# Patient Record
Sex: Female | Born: 1945 | Race: White | Hispanic: No | Marital: Married
Health system: Southern US, Community
[De-identification: ages and names within clinical notes are randomized; demographics above are authoritative.]

---

## 1999-04-05 ENCOUNTER — Encounter: Payer: Self-pay | Admitting: Family Medicine

## 1999-04-05 ENCOUNTER — Encounter: Admission: RE | Admit: 1999-04-05 | Discharge: 1999-04-05 | Payer: Self-pay | Admitting: Family Medicine

## 1999-05-26 ENCOUNTER — Encounter: Payer: Self-pay | Admitting: Family Medicine

## 1999-05-26 ENCOUNTER — Encounter: Admission: RE | Admit: 1999-05-26 | Discharge: 1999-05-26 | Payer: Self-pay | Admitting: Family Medicine

## 2000-04-10 ENCOUNTER — Encounter: Admission: RE | Admit: 2000-04-10 | Discharge: 2000-04-10 | Payer: Self-pay | Admitting: Family Medicine

## 2000-04-10 ENCOUNTER — Encounter: Payer: Self-pay | Admitting: Family Medicine

## 2001-04-16 ENCOUNTER — Encounter: Admission: RE | Admit: 2001-04-16 | Discharge: 2001-04-16 | Payer: Self-pay | Admitting: Family Medicine

## 2001-04-16 ENCOUNTER — Encounter: Payer: Self-pay | Admitting: Family Medicine

## 2002-11-17 ENCOUNTER — Ambulatory Visit (HOSPITAL_BASED_OUTPATIENT_CLINIC_OR_DEPARTMENT_OTHER): Admission: RE | Admit: 2002-11-17 | Discharge: 2002-11-17 | Payer: Self-pay | Admitting: Podiatry

## 2003-06-10 ENCOUNTER — Encounter: Admission: RE | Admit: 2003-06-10 | Discharge: 2003-06-10 | Payer: Self-pay | Admitting: Family Medicine

## 2003-06-18 ENCOUNTER — Other Ambulatory Visit: Admission: RE | Admit: 2003-06-18 | Discharge: 2003-06-18 | Payer: Self-pay | Admitting: Family Medicine

## 2003-08-07 ENCOUNTER — Ambulatory Visit (HOSPITAL_COMMUNITY): Admission: RE | Admit: 2003-08-07 | Discharge: 2003-08-07 | Payer: Self-pay | Admitting: Gastroenterology

## 2003-08-07 ENCOUNTER — Encounter (INDEPENDENT_AMBULATORY_CARE_PROVIDER_SITE_OTHER): Payer: Self-pay | Admitting: Specialist

## 2004-06-16 ENCOUNTER — Encounter: Admission: RE | Admit: 2004-06-16 | Discharge: 2004-06-16 | Payer: Self-pay | Admitting: Family Medicine

## 2005-06-19 ENCOUNTER — Encounter: Admission: RE | Admit: 2005-06-19 | Discharge: 2005-06-19 | Payer: Self-pay | Admitting: Family Medicine

## 2005-07-05 ENCOUNTER — Other Ambulatory Visit: Admission: RE | Admit: 2005-07-05 | Discharge: 2005-07-05 | Payer: Self-pay | Admitting: Family Medicine

## 2006-07-13 ENCOUNTER — Encounter: Admission: RE | Admit: 2006-07-13 | Discharge: 2006-07-13 | Payer: Self-pay | Admitting: Family Medicine

## 2007-07-18 ENCOUNTER — Encounter: Admission: RE | Admit: 2007-07-18 | Discharge: 2007-07-18 | Payer: Self-pay | Admitting: Family Medicine

## 2008-07-21 ENCOUNTER — Encounter: Admission: RE | Admit: 2008-07-21 | Discharge: 2008-07-21 | Payer: Self-pay | Admitting: Family Medicine

## 2008-07-29 ENCOUNTER — Other Ambulatory Visit: Admission: RE | Admit: 2008-07-29 | Discharge: 2008-07-29 | Payer: Self-pay | Admitting: Family Medicine

## 2008-08-04 ENCOUNTER — Encounter: Admission: RE | Admit: 2008-08-04 | Discharge: 2008-08-04 | Payer: Self-pay | Admitting: Family Medicine

## 2009-07-22 ENCOUNTER — Encounter: Admission: RE | Admit: 2009-07-22 | Discharge: 2009-07-22 | Payer: Self-pay | Admitting: Family Medicine

## 2010-06-18 ENCOUNTER — Other Ambulatory Visit: Payer: Self-pay | Admitting: Family Medicine

## 2010-06-18 DIAGNOSIS — Z1239 Encounter for other screening for malignant neoplasm of breast: Secondary | ICD-10-CM

## 2010-08-01 ENCOUNTER — Ambulatory Visit
Admission: RE | Admit: 2010-08-01 | Discharge: 2010-08-01 | Disposition: A | Discharge status: Home / Self Care | Payer: Medicare Other | Source: Ambulatory Visit | Attending: Family Medicine | Admitting: Family Medicine

## 2010-08-01 DIAGNOSIS — Z1239 Encounter for other screening for malignant neoplasm of breast: Secondary | ICD-10-CM

## 2010-10-14 NOTE — Op Note (Signed)
NAME:  Katherine Lara, Katherine Lara                            ACCOUNT NO.:  [id number]   MEDICAL RECORD NO.:  [id number]                   PATIENT TYPE:  AMB   LOCATION:  ENDO                                 FACILITY:  [facility]   PHYSICIAN:  Bernette Redbird, M.D.                DATE OF BIRTH:  1945-11-24   DATE OF PROCEDURE:  08/07/2003  DATE OF DISCHARGE:                                 OPERATIVE REPORT   PROCEDURE:  Colonoscopy with polypectomy and biopsy.   INDICATIONS:  First ever colonoscopy for colon cancer screening in a 65-year-  old female with a negative sigmoidoscopy several years ago.   FINDINGS:  1. Difficult exam.  2. Two small polyps removed.   DESCRIPTION OF PROCEDURE:  The nature, purpose and risks of the procedure  have been discussed with the patient who provided written consent.  Sedation  was fentanyl 100 mcg and Versed 10 mg IV without arrhythmias or  desaturation.   The Olympus adjustable tension pediatric video coloscope was advanced around  the colon with considerable looping which we were unable to control by  external abdominal depression.  Eventually, I pulled back the scope, got out  a major loop and was able to advance to the base of the cecum using external  abdominal depression.  Again the pull-back was then performed.  It is felt  that the adult scope might be better to use on future occasions.   Just above the cecum was roughly 3-4 mm sessile polyp removed by the  polypectomy snare, being transected and excised before any cautery could be  applied.  Polyp fragments were suctioned through the scope for histologic  analysis.  There was a __________ transient ooze with an estimated blood  loss of less than 1 mL.  Pull-back was then continued around the colon.  At  32 cm there was a sessile hyperplastic-appearing very flat polyp biopsied  x1.   There was a rare left-sided diverticulum.  No large polyps, cancer, colitis,  or vascular malformations were seen.   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   The patient tolerated the procedure well and there were no apparent  complications.   IMPRESSION:  1. Small colon polyps treated as described above (211.3).  2. Minimal left-sided diverticulosis.  3. Technically difficult exam   PLAN:  Await pathology on the polyps.                                               Bernette Redbird, M.D.    RB/MEDQ  D:  08/07/2003  T:  08/07/2003  Job:  [id number]   cc:   Pam Drown, M.D.  [address]  [place]  [place] [address]  Fax: [phone]

## 2010-10-14 NOTE — Op Note (Signed)
   NAME:  Katherine Lara, Katherine Lara                            ACCOUNT NO.:  [id number]   MEDICAL RECORD NO.:  [id number]                   PATIENT TYPE:  AMB   LOCATION:  DSC                                  FACILITY:  [facility]   PHYSICIAN:  Ezequiel Kayser. Ajlouny, D.P.M.           DATE OF BIRTH:  Nov 10, 1945   DATE OF PROCEDURE:  11/17/2002  DATE OF DISCHARGE:                                 OPERATIVE REPORT   PREOPERATIVE DIAGNOSIS:  Paronychia medial border left great toenail and  lytic great toenails bilaterally.   POSTOPERATIVE DIAGNOSIS:  Paronychia medial border left great toenail and  lytic great toenails bilaterally.   PROCEDURE:  Excision nail matrix medial border, left great toenail and  debridement both great toenails bilaterally.   SURGEON:  Ezequiel Kayser. Ajlouny, D.P.M.   COMPLICATIONS:  None.   DESCRIPTION OF PROCEDURE:  The patient was brought to the operating room and  placed in the supine position at which time monitored anesthesia was  administered.  A local block was performed with a total of 5 mL of a mixture  of 2% lidocaine and 0.5% Marcaine plain.  A latex-free glove was used to  exsanguinate the left hallux after it was prepped with Betadine.  Using the  English anvil, the invading border of the nail was excised and removed.  Three applications of Phenol for 30 seconds each were applied, cauterized  the matrix.  The area was debrided with curettage.  The phenol was then  neutralized with alcohol.  The left great toenail was then debrided of all  lytic nail.  The toe was dressed with Bacitracin, 4 x 4, and Kling.  The  latex glove used as a tourniquet was removed, and capillary refill returned  to preoperative levels.   Attention was directed to the right great toenail which was also debrided of  all lytic portions from the nail.  The patient was then sent to the recovery  room with vital signs stable and capillary refill at preoperative levels.  Postoperative instructions were  given to the patient.  Postoperative shoes  discussed.  The patient is instructed to follow up and to contact the office  immediately if there are any problems.                                               Ezequiel Kayser. Harriet Pho, D.P.M.    MJA/MEDQ  D:  11/17/2002  T:  11/18/2002  Job:  [id number]   cc:   Charlynn Court, M.D.  [address]  [place], [place] [address]

## 2011-06-27 ENCOUNTER — Other Ambulatory Visit: Payer: Self-pay | Admitting: Family Medicine

## 2011-06-27 DIAGNOSIS — Z1231 Encounter for screening mammogram for malignant neoplasm of breast: Secondary | ICD-10-CM

## 2011-08-02 ENCOUNTER — Ambulatory Visit
Admission: RE | Admit: 2011-08-02 | Discharge: 2011-08-02 | Disposition: A | Discharge status: Home / Self Care | Payer: Medicare Other | Source: Ambulatory Visit | Attending: Family Medicine | Admitting: Family Medicine

## 2011-08-02 DIAGNOSIS — Z1231 Encounter for screening mammogram for malignant neoplasm of breast: Secondary | ICD-10-CM

## 2012-07-05 ENCOUNTER — Other Ambulatory Visit: Payer: Self-pay | Admitting: Family Medicine

## 2012-07-05 DIAGNOSIS — Z1231 Encounter for screening mammogram for malignant neoplasm of breast: Secondary | ICD-10-CM

## 2012-08-02 ENCOUNTER — Ambulatory Visit: Payer: Medicare Other

## 2012-08-06 ENCOUNTER — Ambulatory Visit
Admission: RE | Admit: 2012-08-06 | Discharge: 2012-08-06 | Disposition: A | Discharge status: Home / Self Care | Payer: Medicare Other | Source: Ambulatory Visit | Attending: Family Medicine | Admitting: Family Medicine

## 2012-08-06 DIAGNOSIS — Z1231 Encounter for screening mammogram for malignant neoplasm of breast: Secondary | ICD-10-CM

## 2012-08-19 ENCOUNTER — Other Ambulatory Visit (HOSPITAL_COMMUNITY)
Admission: RE | Admit: 2012-08-19 | Discharge: 2012-08-19 | Disposition: A | Discharge status: Home / Self Care | Payer: Medicare Other | Source: Ambulatory Visit | Attending: Family Medicine | Admitting: Family Medicine

## 2012-08-19 ENCOUNTER — Other Ambulatory Visit: Payer: Self-pay | Admitting: Family Medicine

## 2012-08-19 DIAGNOSIS — Z1151 Encounter for screening for human papillomavirus (HPV): Secondary | ICD-10-CM | POA: Insufficient documentation

## 2012-08-19 DIAGNOSIS — Z0000 Encounter for general adult medical examination without abnormal findings: Secondary | ICD-10-CM | POA: Insufficient documentation

## 2013-07-02 ENCOUNTER — Other Ambulatory Visit: Payer: Self-pay

## 2013-07-02 DIAGNOSIS — Z1231 Encounter for screening mammogram for malignant neoplasm of breast: Secondary | ICD-10-CM

## 2013-08-07 ENCOUNTER — Ambulatory Visit
Admission: RE | Admit: 2013-08-07 | Discharge: 2013-08-07 | Disposition: A | Discharge status: Home / Self Care | Payer: Medicare Other | Source: Ambulatory Visit

## 2013-08-07 DIAGNOSIS — Z1231 Encounter for screening mammogram for malignant neoplasm of breast: Secondary | ICD-10-CM

## 2014-07-06 ENCOUNTER — Other Ambulatory Visit: Payer: Self-pay

## 2014-07-06 DIAGNOSIS — Z1231 Encounter for screening mammogram for malignant neoplasm of breast: Secondary | ICD-10-CM

## 2014-08-17 ENCOUNTER — Ambulatory Visit
Admission: RE | Admit: 2014-08-17 | Discharge: 2014-08-17 | Disposition: A | Discharge status: Home / Self Care | Payer: Medicare Other | Source: Ambulatory Visit

## 2014-08-17 DIAGNOSIS — Z1231 Encounter for screening mammogram for malignant neoplasm of breast: Secondary | ICD-10-CM

## 2015-07-12 ENCOUNTER — Other Ambulatory Visit: Payer: Self-pay

## 2015-07-12 DIAGNOSIS — Z1231 Encounter for screening mammogram for malignant neoplasm of breast: Secondary | ICD-10-CM

## 2015-08-23 ENCOUNTER — Ambulatory Visit
Admission: RE | Admit: 2015-08-23 | Discharge: 2015-08-23 | Disposition: A | Discharge status: Home / Self Care | Payer: Medicare Other | Source: Ambulatory Visit

## 2015-08-23 DIAGNOSIS — Z1231 Encounter for screening mammogram for malignant neoplasm of breast: Secondary | ICD-10-CM

## 2016-07-17 ENCOUNTER — Other Ambulatory Visit: Payer: Self-pay | Admitting: Family Medicine

## 2016-07-17 DIAGNOSIS — Z1231 Encounter for screening mammogram for malignant neoplasm of breast: Secondary | ICD-10-CM

## 2016-08-25 ENCOUNTER — Ambulatory Visit
Admission: RE | Admit: 2016-08-25 | Discharge: 2016-08-25 | Disposition: A | Discharge status: Home / Self Care | Payer: Medicare Other | Source: Ambulatory Visit | Attending: Family Medicine | Admitting: Family Medicine

## 2016-08-25 DIAGNOSIS — Z1231 Encounter for screening mammogram for malignant neoplasm of breast: Secondary | ICD-10-CM

## 2017-07-19 ENCOUNTER — Other Ambulatory Visit: Payer: Self-pay | Admitting: Family Medicine

## 2017-07-19 DIAGNOSIS — Z1231 Encounter for screening mammogram for malignant neoplasm of breast: Secondary | ICD-10-CM

## 2017-08-28 ENCOUNTER — Ambulatory Visit
Admission: RE | Admit: 2017-08-28 | Discharge: 2017-08-28 | Disposition: A | Discharge status: Home / Self Care | Payer: Medicare Other | Source: Ambulatory Visit | Attending: Family Medicine | Admitting: Family Medicine

## 2017-08-28 DIAGNOSIS — Z1231 Encounter for screening mammogram for malignant neoplasm of breast: Secondary | ICD-10-CM

## 2018-07-31 ENCOUNTER — Other Ambulatory Visit: Payer: Self-pay | Admitting: Family Medicine

## 2018-07-31 DIAGNOSIS — Z1231 Encounter for screening mammogram for malignant neoplasm of breast: Secondary | ICD-10-CM

## 2018-09-02 ENCOUNTER — Ambulatory Visit: Payer: Medicare Other

## 2018-10-14 ENCOUNTER — Ambulatory Visit: Payer: Medicare Other

## 2018-11-25 ENCOUNTER — Ambulatory Visit
Admission: RE | Admit: 2018-11-25 | Discharge: 2018-11-25 | Disposition: A | Discharge status: Home / Self Care | Payer: Medicare Other | Source: Ambulatory Visit | Attending: Family Medicine | Admitting: Family Medicine

## 2018-11-25 ENCOUNTER — Other Ambulatory Visit: Payer: Self-pay

## 2018-11-25 DIAGNOSIS — Z1231 Encounter for screening mammogram for malignant neoplasm of breast: Secondary | ICD-10-CM

## 2019-06-11 ENCOUNTER — Ambulatory Visit: Payer: Medicare Other | Attending: Internal Medicine

## 2019-06-11 DIAGNOSIS — Z23 Encounter for immunization: Secondary | ICD-10-CM | POA: Insufficient documentation

## 2019-06-11 NOTE — Progress Notes (Signed)
   Covid-19 Vaccination Clinic  Name:  Tammatha Primavera    MRN: [id number] DOB: 03-Jul-1945  06/11/2019  Ms. Faas was observed post Covid-19 immunization for 15 minutes without incidence. She was provided with Vaccine Information Sheet and instruction to access the V-Safe system.   Ms. Lanter was instructed to call 911 with any severe reactions post vaccine: Marland Kitchen Difficulty breathing  . Swelling of your face and throat  . A fast heartbeat  . A bad rash all over your body  . Dizziness and weakness    Immunizations Administered    Name Date Dose VIS Date Route   Pfizer COVID-19 Vaccine 06/11/2019  9:25 AM 0.3 mL 05/09/2019 Intramuscular   Manufacturer: [employer]   Lot: [id number]   [place]: [id number]

## 2019-07-01 ENCOUNTER — Ambulatory Visit: Payer: Medicare Other | Attending: Internal Medicine

## 2019-07-01 DIAGNOSIS — Z23 Encounter for immunization: Secondary | ICD-10-CM | POA: Insufficient documentation

## 2019-07-01 NOTE — Progress Notes (Signed)
   Covid-19 Vaccination Clinic  Name:  Kaaliyah Adan    MRN: [id number] DOB: [birth year]  07/01/2019  Ms. Brizzi was observed post Covid-19 immunization for 15 minutes without incidence. She was provided with Vaccine Information Sheet and instruction to access the V-Safe system.   Ms. Oday was instructed to call 911 with any severe reactions post vaccine: Marland Kitchen Difficulty breathing  . Swelling of your face and throat  . A fast heartbeat  . A bad rash all over your body  . Dizziness and weakness    Immunizations Administered    Name Date Dose VIS Date Route   Pfizer COVID-19 Vaccine 07/01/2019  8:57 AM 0.3 mL 05/09/2019 Intramuscular   Manufacturer: [place]   Lot: [id number]   [place]: [id number]

## 2019-10-13 ENCOUNTER — Other Ambulatory Visit: Payer: Self-pay | Admitting: Family Medicine

## 2019-10-13 DIAGNOSIS — Z1231 Encounter for screening mammogram for malignant neoplasm of breast: Secondary | ICD-10-CM

## 2019-11-26 ENCOUNTER — Ambulatory Visit
Admission: RE | Admit: 2019-11-26 | Discharge: 2019-11-26 | Disposition: A | Discharge status: Home / Self Care | Payer: Medicare Other | Source: Ambulatory Visit | Attending: Family Medicine | Admitting: Family Medicine

## 2019-11-26 ENCOUNTER — Other Ambulatory Visit: Payer: Self-pay

## 2019-11-26 DIAGNOSIS — Z1231 Encounter for screening mammogram for malignant neoplasm of breast: Secondary | ICD-10-CM

## 2020-07-01 DIAGNOSIS — C719 Malignant neoplasm of brain, unspecified: Secondary | ICD-10-CM | POA: Diagnosis not present

## 2020-07-01 DIAGNOSIS — Z510 Encounter for antineoplastic radiation therapy: Secondary | ICD-10-CM | POA: Diagnosis not present

## 2020-07-01 DIAGNOSIS — D331 Benign neoplasm of brain, infratentorial: Secondary | ICD-10-CM | POA: Diagnosis not present

## 2020-07-01 DIAGNOSIS — C716 Malignant neoplasm of cerebellum: Secondary | ICD-10-CM | POA: Diagnosis not present

## 2020-09-14 DIAGNOSIS — L309 Dermatitis, unspecified: Secondary | ICD-10-CM | POA: Diagnosis not present

## 2020-10-13 ENCOUNTER — Other Ambulatory Visit: Payer: Self-pay | Admitting: Family Medicine

## 2020-10-13 DIAGNOSIS — Z1231 Encounter for screening mammogram for malignant neoplasm of breast: Secondary | ICD-10-CM

## 2020-11-23 DIAGNOSIS — B882 Other arthropod infestations: Secondary | ICD-10-CM | POA: Diagnosis not present

## 2020-11-23 DIAGNOSIS — L309 Dermatitis, unspecified: Secondary | ICD-10-CM | POA: Diagnosis not present

## 2020-11-25 DIAGNOSIS — U071 COVID-19: Secondary | ICD-10-CM | POA: Diagnosis not present

## 2020-12-09 ENCOUNTER — Ambulatory Visit
Admission: RE | Admit: 2020-12-09 | Discharge: 2020-12-09 | Disposition: A | Discharge status: Home / Self Care | Payer: Medicare Other | Source: Ambulatory Visit | Attending: Family Medicine | Admitting: Family Medicine

## 2020-12-09 ENCOUNTER — Other Ambulatory Visit: Payer: Self-pay

## 2020-12-09 DIAGNOSIS — Z1231 Encounter for screening mammogram for malignant neoplasm of breast: Secondary | ICD-10-CM | POA: Diagnosis not present

## 2021-01-03 DIAGNOSIS — C719 Malignant neoplasm of brain, unspecified: Secondary | ICD-10-CM | POA: Diagnosis not present

## 2021-01-10 DIAGNOSIS — M85852 Other specified disorders of bone density and structure, left thigh: Secondary | ICD-10-CM | POA: Diagnosis not present

## 2021-01-10 DIAGNOSIS — E673 Hypervitaminosis D: Secondary | ICD-10-CM | POA: Diagnosis not present

## 2021-01-10 DIAGNOSIS — Z0000 Encounter for general adult medical examination without abnormal findings: Secondary | ICD-10-CM | POA: Diagnosis not present

## 2021-01-10 DIAGNOSIS — Z136 Encounter for screening for cardiovascular disorders: Secondary | ICD-10-CM | POA: Diagnosis not present

## 2021-01-10 DIAGNOSIS — R7301 Impaired fasting glucose: Secondary | ICD-10-CM | POA: Diagnosis not present

## 2021-01-10 DIAGNOSIS — C716 Malignant neoplasm of cerebellum: Secondary | ICD-10-CM | POA: Diagnosis not present

## 2021-01-10 DIAGNOSIS — Z13220 Encounter for screening for lipoid disorders: Secondary | ICD-10-CM | POA: Diagnosis not present

## 2021-01-10 DIAGNOSIS — Z1389 Encounter for screening for other disorder: Secondary | ICD-10-CM | POA: Diagnosis not present

## 2021-02-16 DIAGNOSIS — H524 Presbyopia: Secondary | ICD-10-CM | POA: Diagnosis not present

## 2021-02-16 DIAGNOSIS — Z961 Presence of intraocular lens: Secondary | ICD-10-CM | POA: Diagnosis not present

## 2021-02-16 DIAGNOSIS — H5213 Myopia, bilateral: Secondary | ICD-10-CM | POA: Diagnosis not present

## 2021-02-16 DIAGNOSIS — H35363 Drusen (degenerative) of macula, bilateral: Secondary | ICD-10-CM | POA: Diagnosis not present

## 2021-10-05 ENCOUNTER — Other Ambulatory Visit: Payer: Self-pay | Admitting: Family Medicine

## 2021-10-05 DIAGNOSIS — M85852 Other specified disorders of bone density and structure, left thigh: Secondary | ICD-10-CM

## 2021-10-09 DIAGNOSIS — L247 Irritant contact dermatitis due to plants, except food: Secondary | ICD-10-CM | POA: Diagnosis not present

## 2021-10-26 ENCOUNTER — Other Ambulatory Visit: Payer: Self-pay | Admitting: Family Medicine

## 2021-10-26 DIAGNOSIS — Z1231 Encounter for screening mammogram for malignant neoplasm of breast: Secondary | ICD-10-CM

## 2021-12-09 IMAGING — MG MM DIGITAL SCREENING BILAT W/ TOMO AND CAD
6 of 10 series · 6 of 30 positions shown · non-contrast
Comparison: Previous exam(s).

CLINICAL DATA: Screening.

EXAM:
DIGITAL SCREENING BILATERAL MAMMOGRAM WITH TOMOSYNTHESIS AND CAD
TECHNIQUE: Bilateral screening digital craniocaudal and mediolateral oblique
mammograms were obtained. Bilateral screening digital breast
tomosynthesis was performed. The images were evaluated with
computer-aided detection.

[R CC synth-2D (1 of 2)]
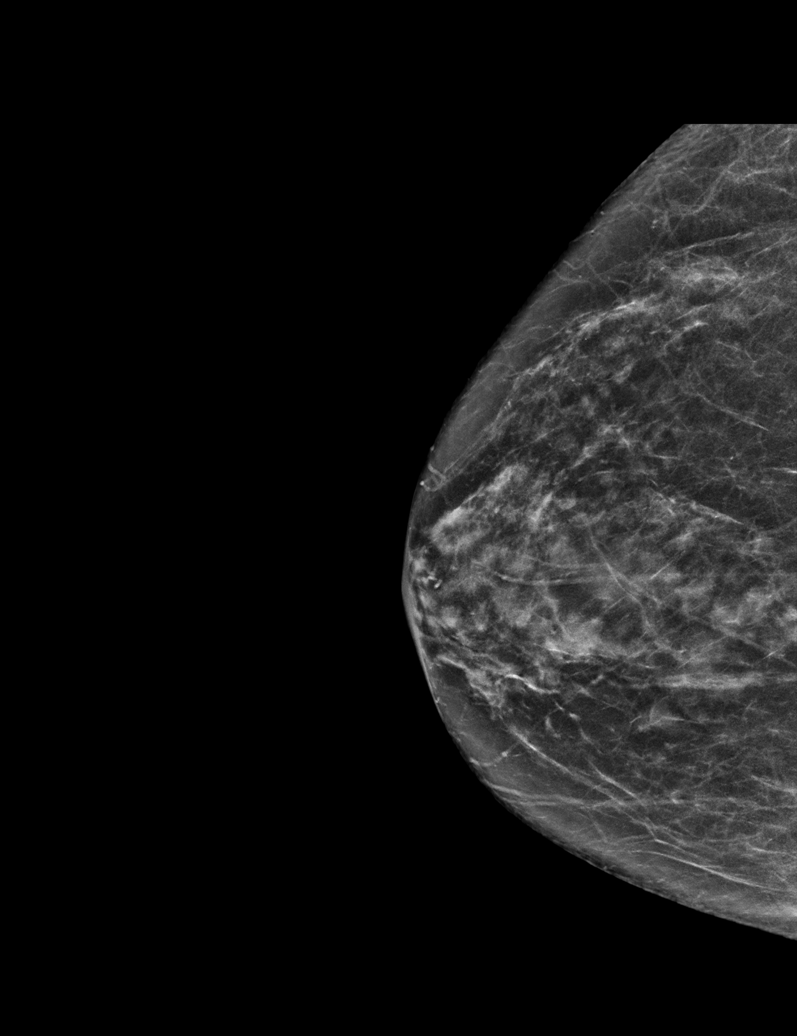

[R MLO synth-2D]
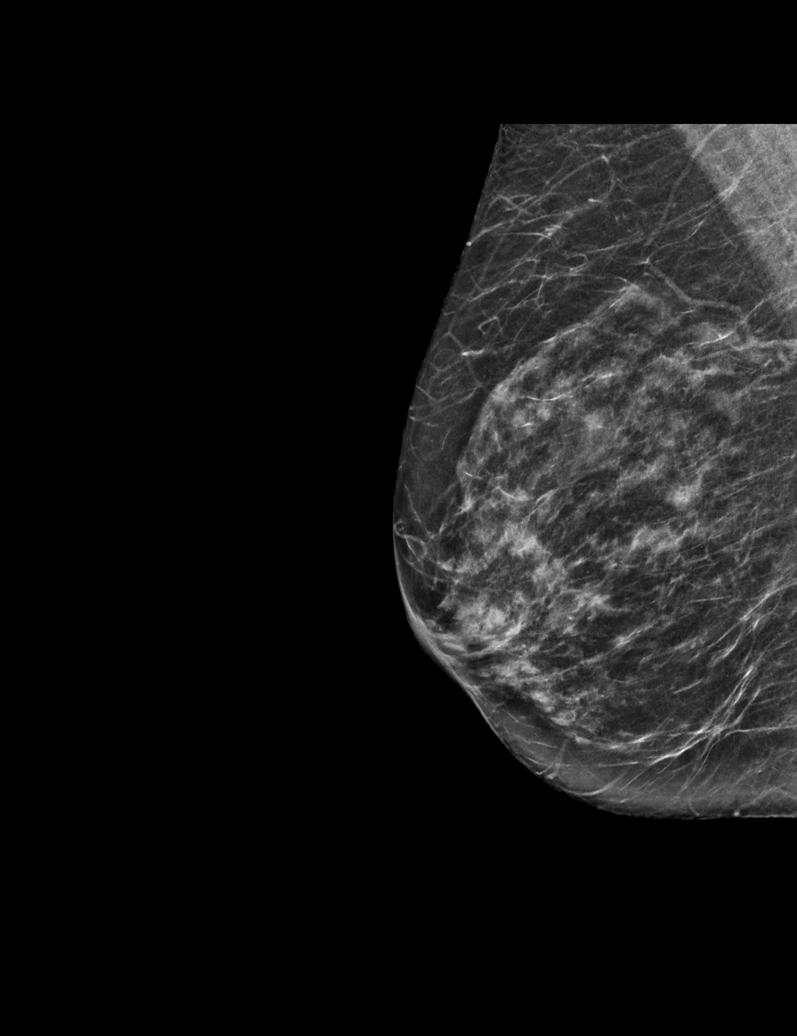

[L MLO synth-2D]
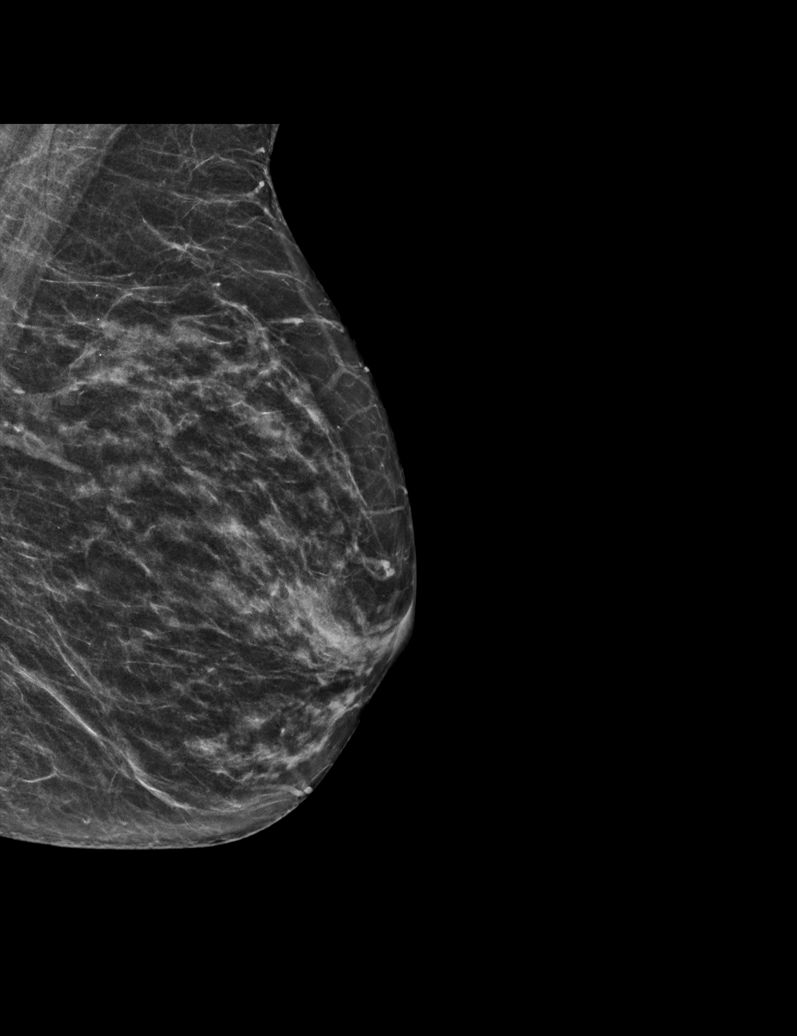

[R CC synth-2D (2 of 2)]
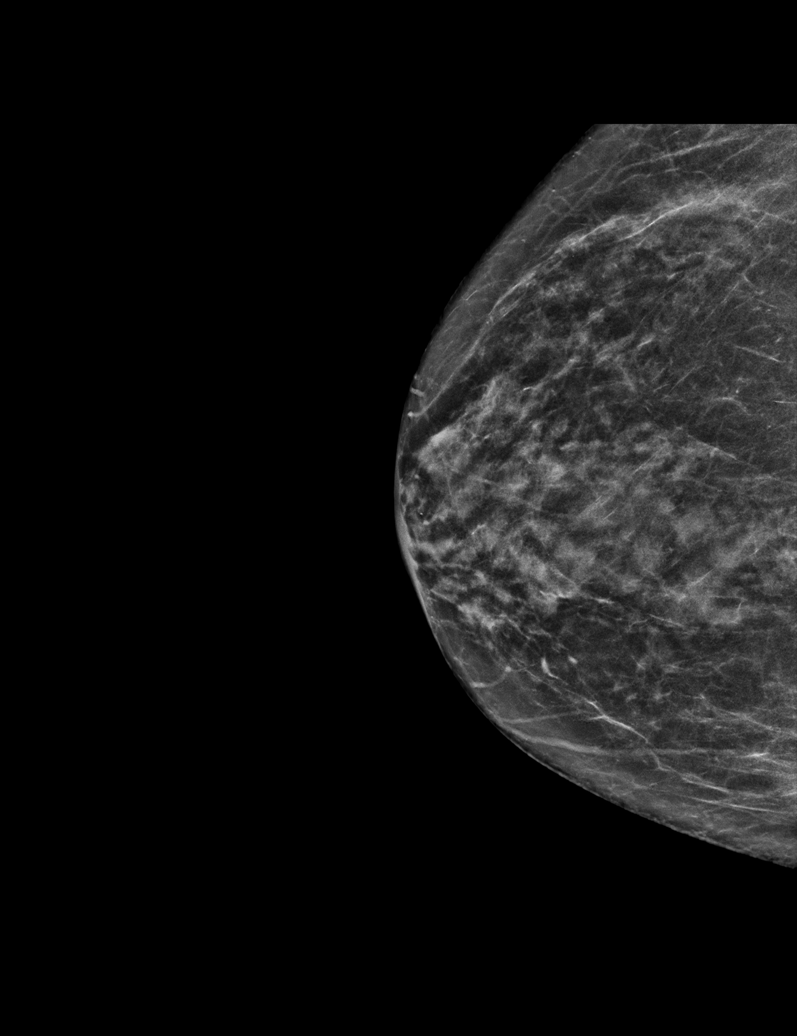

[L CC synth-2D]
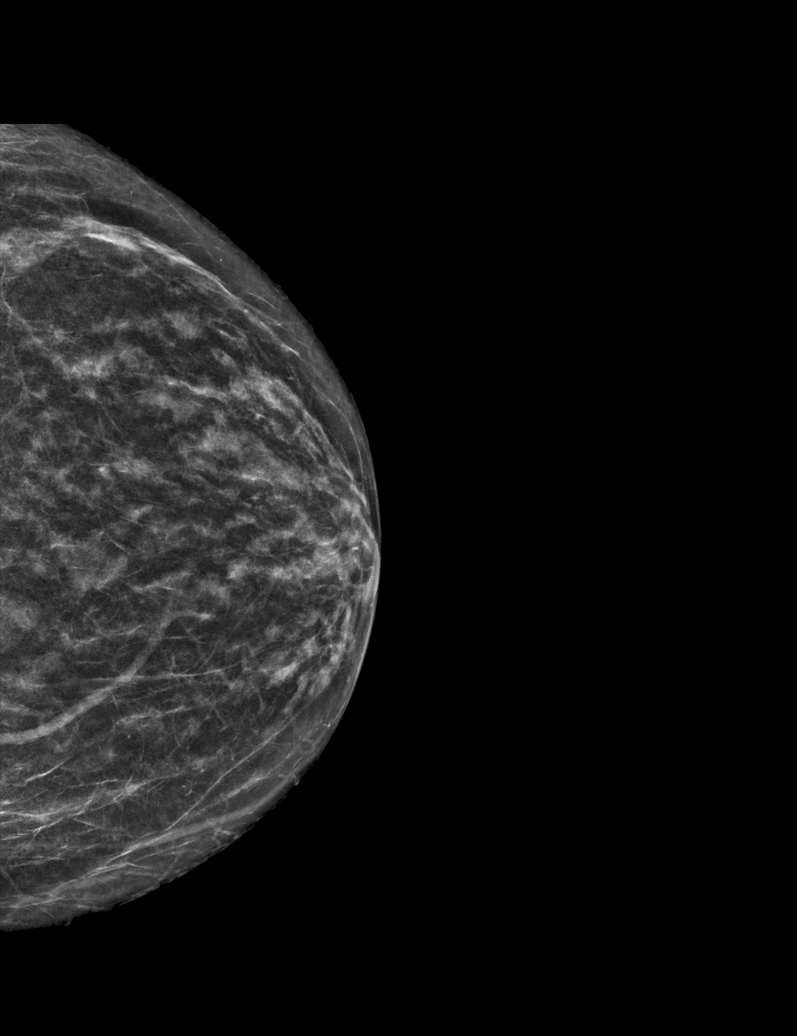

[R CC tomo · tomo slice 26/51.0]
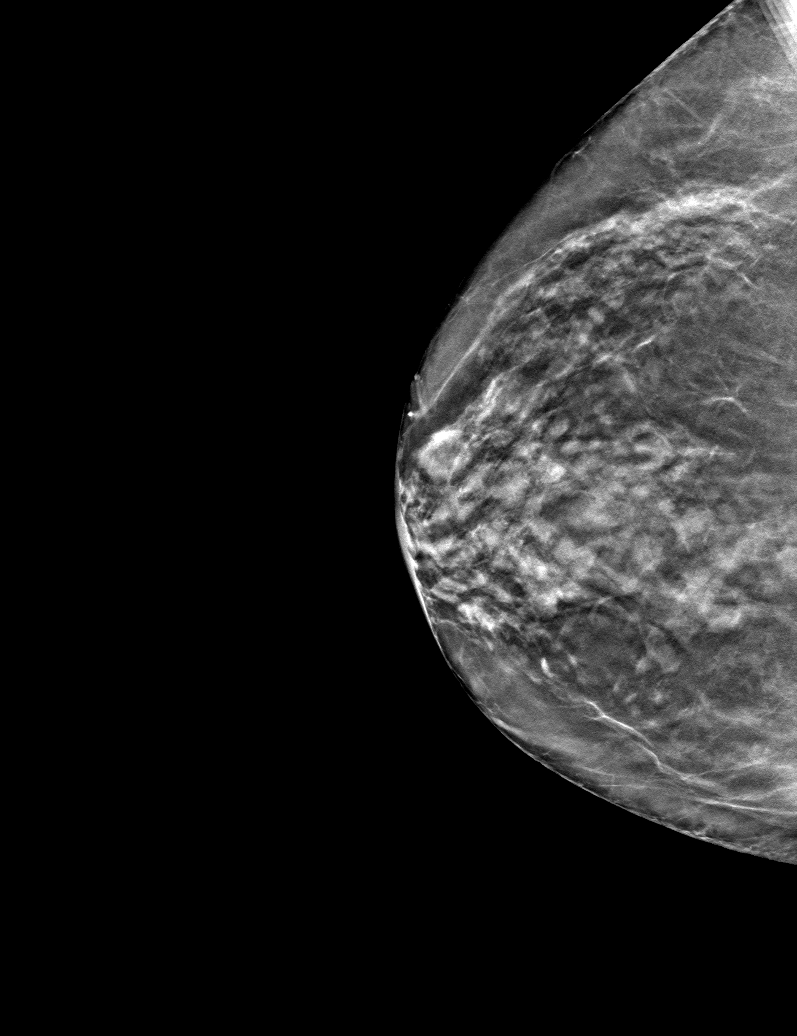

[6 of 30 positions shown; findings below may reference images not displayed]

ACR Breast Density Category c: The breast tissue is heterogeneously
dense, which may obscure small masses.
FINDINGS: There are no findings suspicious for malignancy.
IMPRESSION: No mammographic evidence of malignancy. A result letter of this
screening mammogram will be mailed directly to the patient.

RECOMMENDATION:
Screening mammogram in one year. (Code:Q3-W-BC3)

BI-RADS CATEGORY  1: Negative.

## 2021-12-12 ENCOUNTER — Ambulatory Visit
Admission: RE | Admit: 2021-12-12 | Discharge: 2021-12-12 | Disposition: A | Discharge status: Home / Self Care | Payer: Medicare Other | Source: Ambulatory Visit | Attending: Family Medicine | Admitting: Family Medicine

## 2021-12-12 DIAGNOSIS — Z1231 Encounter for screening mammogram for malignant neoplasm of breast: Secondary | ICD-10-CM | POA: Diagnosis not present

## 2022-01-04 DIAGNOSIS — M85852 Other specified disorders of bone density and structure, left thigh: Secondary | ICD-10-CM | POA: Diagnosis not present

## 2022-01-16 DIAGNOSIS — Z0000 Encounter for general adult medical examination without abnormal findings: Secondary | ICD-10-CM | POA: Diagnosis not present

## 2022-01-16 DIAGNOSIS — R634 Abnormal weight loss: Secondary | ICD-10-CM | POA: Diagnosis not present

## 2022-01-16 DIAGNOSIS — Z681 Body mass index (BMI) 19 or less, adult: Secondary | ICD-10-CM | POA: Diagnosis not present

## 2022-02-23 DIAGNOSIS — H353132 Nonexudative age-related macular degeneration, bilateral, intermediate dry stage: Secondary | ICD-10-CM | POA: Diagnosis not present

## 2022-02-23 DIAGNOSIS — H524 Presbyopia: Secondary | ICD-10-CM | POA: Diagnosis not present

## 2022-02-23 DIAGNOSIS — Z961 Presence of intraocular lens: Secondary | ICD-10-CM | POA: Diagnosis not present

## 2022-04-05 ENCOUNTER — Ambulatory Visit
Admission: RE | Admit: 2022-04-05 | Discharge: 2022-04-05 | Disposition: A | Discharge status: Home / Self Care | Payer: Medicare Other | Source: Ambulatory Visit | Attending: Family Medicine | Admitting: Family Medicine

## 2022-04-05 DIAGNOSIS — M85852 Other specified disorders of bone density and structure, left thigh: Secondary | ICD-10-CM

## 2022-04-05 DIAGNOSIS — Z780 Asymptomatic menopausal state: Secondary | ICD-10-CM | POA: Diagnosis not present

## 2022-04-05 DIAGNOSIS — M8589 Other specified disorders of bone density and structure, multiple sites: Secondary | ICD-10-CM | POA: Diagnosis not present

## 2022-08-10 DIAGNOSIS — I6781 Acute cerebrovascular insufficiency: Secondary | ICD-10-CM | POA: Diagnosis not present

## 2022-08-10 DIAGNOSIS — C719 Malignant neoplasm of brain, unspecified: Secondary | ICD-10-CM | POA: Diagnosis not present

## 2022-08-10 DIAGNOSIS — G9389 Other specified disorders of brain: Secondary | ICD-10-CM | POA: Diagnosis not present

## 2022-08-10 DIAGNOSIS — C718 Malignant neoplasm of overlapping sites of brain: Secondary | ICD-10-CM | POA: Diagnosis not present

## 2022-08-10 DIAGNOSIS — C710 Malignant neoplasm of cerebrum, except lobes and ventricles: Secondary | ICD-10-CM | POA: Diagnosis not present

## 2022-09-29 DIAGNOSIS — H1132 Conjunctival hemorrhage, left eye: Secondary | ICD-10-CM | POA: Diagnosis not present

## 2022-10-30 ENCOUNTER — Other Ambulatory Visit: Payer: Self-pay | Admitting: Family Medicine

## 2022-10-30 DIAGNOSIS — Z1231 Encounter for screening mammogram for malignant neoplasm of breast: Secondary | ICD-10-CM

## 2022-12-14 ENCOUNTER — Ambulatory Visit
Admission: RE | Admit: 2022-12-14 | Discharge: 2022-12-14 | Disposition: A | Discharge status: Home / Self Care | Payer: Medicare Other | Source: Ambulatory Visit | Attending: Family Medicine | Admitting: Family Medicine

## 2022-12-14 DIAGNOSIS — Z1231 Encounter for screening mammogram for malignant neoplasm of breast: Secondary | ICD-10-CM

## 2023-01-22 DIAGNOSIS — Z681 Body mass index (BMI) 19 or less, adult: Secondary | ICD-10-CM | POA: Diagnosis not present

## 2023-01-22 DIAGNOSIS — E441 Mild protein-calorie malnutrition: Secondary | ICD-10-CM | POA: Diagnosis not present

## 2023-01-22 DIAGNOSIS — Z0000 Encounter for general adult medical examination without abnormal findings: Secondary | ICD-10-CM | POA: Diagnosis not present

## 2023-01-22 DIAGNOSIS — C716 Malignant neoplasm of cerebellum: Secondary | ICD-10-CM | POA: Diagnosis not present

## 2023-01-22 DIAGNOSIS — M85852 Other specified disorders of bone density and structure, left thigh: Secondary | ICD-10-CM | POA: Diagnosis not present

## 2023-01-22 DIAGNOSIS — Z23 Encounter for immunization: Secondary | ICD-10-CM | POA: Diagnosis not present

## 2023-02-12 DIAGNOSIS — R0981 Nasal congestion: Secondary | ICD-10-CM | POA: Diagnosis not present

## 2023-02-12 DIAGNOSIS — R509 Fever, unspecified: Secondary | ICD-10-CM | POA: Diagnosis not present

## 2023-03-01 DIAGNOSIS — H353132 Nonexudative age-related macular degeneration, bilateral, intermediate dry stage: Secondary | ICD-10-CM | POA: Diagnosis not present

## 2023-03-01 DIAGNOSIS — Z961 Presence of intraocular lens: Secondary | ICD-10-CM | POA: Diagnosis not present

## 2023-03-01 DIAGNOSIS — H5213 Myopia, bilateral: Secondary | ICD-10-CM | POA: Diagnosis not present

## 2023-05-29 DIAGNOSIS — R1084 Generalized abdominal pain: Secondary | ICD-10-CM | POA: Diagnosis not present

## 2023-05-29 DIAGNOSIS — E441 Mild protein-calorie malnutrition: Secondary | ICD-10-CM | POA: Diagnosis not present

## 2023-06-04 DIAGNOSIS — R1084 Generalized abdominal pain: Secondary | ICD-10-CM | POA: Diagnosis not present

## 2023-06-04 DIAGNOSIS — Z23 Encounter for immunization: Secondary | ICD-10-CM | POA: Diagnosis not present

## 2023-06-04 DIAGNOSIS — M542 Cervicalgia: Secondary | ICD-10-CM | POA: Diagnosis not present

## 2023-07-05 DIAGNOSIS — R1084 Generalized abdominal pain: Secondary | ICD-10-CM | POA: Diagnosis not present

## 2023-11-05 DIAGNOSIS — Z681 Body mass index (BMI) 19 or less, adult: Secondary | ICD-10-CM | POA: Diagnosis not present

## 2023-11-05 DIAGNOSIS — B9789 Other viral agents as the cause of diseases classified elsewhere: Secondary | ICD-10-CM | POA: Diagnosis not present

## 2023-11-05 DIAGNOSIS — R051 Acute cough: Secondary | ICD-10-CM | POA: Diagnosis not present

## 2023-11-05 DIAGNOSIS — J988 Other specified respiratory disorders: Secondary | ICD-10-CM | POA: Diagnosis not present

## 2023-11-19 ENCOUNTER — Other Ambulatory Visit: Payer: Self-pay | Admitting: Family Medicine

## 2023-11-19 DIAGNOSIS — Z1231 Encounter for screening mammogram for malignant neoplasm of breast: Secondary | ICD-10-CM

## 2023-12-26 ENCOUNTER — Ambulatory Visit
Admission: RE | Admit: 2023-12-26 | Discharge: 2023-12-26 | Disposition: A | Discharge status: Home / Self Care | Source: Ambulatory Visit | Attending: Family Medicine | Admitting: Family Medicine

## 2023-12-26 DIAGNOSIS — Z1231 Encounter for screening mammogram for malignant neoplasm of breast: Secondary | ICD-10-CM | POA: Diagnosis not present

## 2024-01-23 DIAGNOSIS — M85852 Other specified disorders of bone density and structure, left thigh: Secondary | ICD-10-CM | POA: Diagnosis not present

## 2024-01-31 DIAGNOSIS — Z0000 Encounter for general adult medical examination without abnormal findings: Secondary | ICD-10-CM | POA: Diagnosis not present

## 2024-01-31 DIAGNOSIS — H9193 Unspecified hearing loss, bilateral: Secondary | ICD-10-CM | POA: Diagnosis not present

## 2024-03-05 DIAGNOSIS — H52203 Unspecified astigmatism, bilateral: Secondary | ICD-10-CM | POA: Diagnosis not present

## 2024-03-05 DIAGNOSIS — Z961 Presence of intraocular lens: Secondary | ICD-10-CM | POA: Diagnosis not present

## 2024-03-05 DIAGNOSIS — H353222 Exudative age-related macular degeneration, left eye, with inactive choroidal neovascularization: Secondary | ICD-10-CM | POA: Diagnosis not present

## 2024-03-14 DIAGNOSIS — H353113 Nonexudative age-related macular degeneration, right eye, advanced atrophic without subfoveal involvement: Secondary | ICD-10-CM | POA: Diagnosis not present

## 2024-03-14 DIAGNOSIS — H353221 Exudative age-related macular degeneration, left eye, with active choroidal neovascularization: Secondary | ICD-10-CM | POA: Diagnosis not present

## 2024-03-14 DIAGNOSIS — H43813 Vitreous degeneration, bilateral: Secondary | ICD-10-CM | POA: Diagnosis not present

## 2024-06-03 DIAGNOSIS — M25511 Pain in right shoulder: Secondary | ICD-10-CM

## 2024-06-03 DIAGNOSIS — M75101 Unspecified rotator cuff tear or rupture of right shoulder, not specified as traumatic: Secondary | ICD-10-CM

## 2024-06-04 ENCOUNTER — Other Ambulatory Visit: Payer: Self-pay | Admitting: Physical Medicine and Rehabilitation

## 2024-06-04 DIAGNOSIS — G8929 Other chronic pain: Secondary | ICD-10-CM

## 2024-06-04 DIAGNOSIS — M75101 Unspecified rotator cuff tear or rupture of right shoulder, not specified as traumatic: Secondary | ICD-10-CM

## 2024-06-04 DIAGNOSIS — S46211A Strain of muscle, fascia and tendon of other parts of biceps, right arm, initial encounter: Secondary | ICD-10-CM

## 2024-06-20 ENCOUNTER — Ambulatory Visit
Admission: RE | Admit: 2024-06-20 | Discharge: 2024-06-20 | Disposition: A | Source: Ambulatory Visit | Attending: Physical Medicine and Rehabilitation | Admitting: Physical Medicine and Rehabilitation

## 2024-06-20 ENCOUNTER — Other Ambulatory Visit: Payer: Self-pay

## 2024-06-20 DIAGNOSIS — S46211A Strain of muscle, fascia and tendon of other parts of biceps, right arm, initial encounter: Secondary | ICD-10-CM

## 2024-06-20 DIAGNOSIS — G8929 Other chronic pain: Secondary | ICD-10-CM

## 2024-06-20 DIAGNOSIS — M75101 Unspecified rotator cuff tear or rupture of right shoulder, not specified as traumatic: Secondary | ICD-10-CM
